# Patient Record
Sex: Male | Born: 1990 | Race: White | Hispanic: No | Marital: Single | Smoking: Never smoker
Health system: Southern US, Community
[De-identification: ages and names within clinical notes are randomized; demographics above are authoritative.]

---

## 2011-06-30 ENCOUNTER — Emergency Department (HOSPITAL_BASED_OUTPATIENT_CLINIC_OR_DEPARTMENT_OTHER)
Admission: EM | Admit: 2011-06-30 | Discharge: 2011-06-30 | Disposition: A | Discharge status: Home / Self Care | Payer: Managed Care, Other (non HMO) | Attending: Emergency Medicine | Admitting: Emergency Medicine

## 2011-06-30 ENCOUNTER — Encounter: Payer: Self-pay | Admitting: *Deleted

## 2011-06-30 DIAGNOSIS — J029 Acute pharyngitis, unspecified: Secondary | ICD-10-CM | POA: Insufficient documentation

## 2011-06-30 DIAGNOSIS — B9789 Other viral agents as the cause of diseases classified elsewhere: Secondary | ICD-10-CM | POA: Insufficient documentation

## 2011-06-30 DIAGNOSIS — B349 Viral infection, unspecified: Secondary | ICD-10-CM

## 2011-06-30 LAB — RAPID STREP SCREEN (MED CTR MEBANE ONLY): Streptococcus, Group A Screen (Direct): NEGATIVE

## 2011-06-30 NOTE — ED Notes (Signed)
Dr ray at bedside to evalutate pt  Strept screen collected by dr ray ...taken to lab

## 2011-06-30 NOTE — ED Notes (Signed)
Patient states pain has gotten worse x 3 days.

## 2011-06-30 NOTE — ED Provider Notes (Signed)
History     CSN: [id number] Arrival date & time: 06/30/2011 12:37 PM  Chief Complaint  Patient presents with  . Sore Throat  . Fever   The history is provided by the patient.   patient here complains of sore throat for 3 days. He has had a fever since Saturday. He was seen at [facility] clinic on Saturday and had a strep screen that was negative. He states that he has used ibuprofen with control of his fever but that when he is not taking it has gone up to as high as 103. He has had some headache and muscle aches. He is not had any cough or shortness of breath nausea or vomiting.  History reviewed. No pertinent past medical history.  History reviewed. No pertinent past surgical history.  History reviewed. No pertinent family history.  History  Substance Use Topics  . Smoking status: Not on file  . Smokeless tobacco: Never Used  . Alcohol Use: Yes     socially      Review of Systems  All other systems reviewed and are negative.    Physical Exam  BP 131/88  Pulse 73  Temp(Src) 99.4 F (37.4 C) (Oral)  Resp 16  Ht 6\' 1"  (1.854 m)  Wt 170 lb (77.111 kg)  BMI 22.43 kg/m2  SpO2 100%  Physical Exam  Constitutional: He appears well-developed.   well-developed well-nourished male sitting in the bed does not appear to be in any acute distress heart rate 73 respiratory rate is 16 blood pressure 130/88 oxygen saturation levels 100% his temperature is 99.4 degrees HEENT normocephalic atraumatic eyes pupils are equal round reactive to light Ears extra sternal ears are normal tympanic membranes are clear nares or pain no discharges noted Oropharynx mucous membranes are moist there is some inflammation and pustules in the oropharynx. Neck some submandibular adenopathy. Neck is supple. Chest wall is normal. Lungs clear to auscultation Heart regular rate and rhythm no murmurs or gallops are appreciated. Abdomen soft and nontender no hepatomegaly or splenomegaly noted Lower extremities  are normal Skin no evidence of rashes noted.  ED Course  Procedures  MDM The patient was negative strep screen here. I have discussed with he and his parents that this is likely viral in etiology. They voice understanding and he will call his primary doctor on Thursday.      Hilario Quarry, MD 06/30/11 [phone]

## 2013-10-17 ENCOUNTER — Ambulatory Visit (INDEPENDENT_AMBULATORY_CARE_PROVIDER_SITE_OTHER): Payer: Managed Care, Other (non HMO) | Admitting: Sports Medicine

## 2013-10-17 ENCOUNTER — Other Ambulatory Visit: Payer: Self-pay | Admitting: Sports Medicine

## 2013-10-17 ENCOUNTER — Ambulatory Visit: Payer: Managed Care, Other (non HMO)

## 2013-10-17 ENCOUNTER — Encounter: Payer: Self-pay | Admitting: Sports Medicine

## 2013-10-17 VITALS — BP 153/67 | HR 69 | Wt 184.0 lb

## 2013-10-17 DIAGNOSIS — M542 Cervicalgia: Secondary | ICD-10-CM

## 2013-10-17 MED ORDER — MELOXICAM 15 MG PO TABS: ORAL_TABLET | ORAL | Status: AC

## 2013-10-17 MED ORDER — PREDNISONE 50 MG PO TABS: ORAL_TABLET | ORAL | Status: AC

## 2013-10-17 MED ORDER — CYCLOBENZAPRINE HCL 10 MG PO TABS: ORAL_TABLET | ORAL | Status: AC

## 2013-10-17 NOTE — Progress Notes (Signed)
  Subjective:    CC: Neck pain   HPI:  This is a pleasant 22 year old male, he is in [employer], approximately one year ago he was running while carrying a bulge on his head, unfortunately he got a laceration, and has had neck pain since. The pain is located at the craniocervical junction, worse with flexion, and less so with extension. No problems with turning. He has all axial and no radicular symptoms. Pain is moderate, persistent, no lower extremity symptoms, no bowel or bladder dysfunction.  Past medical history, Surgical history, Family history not pertinant except as noted below, Social history, Allergies, and medications have been entered into the medical record, reviewed, and no changes needed.   Review of Systems: No headache, visual changes, nausea, vomiting, diarrhea, constipation, dizziness, abdominal pain, skin rash, fevers, chills, night sweats, swollen lymph nodes, weight loss, chest pain, body aches, joint swelling, muscle aches, shortness of breath, mood changes, visual or auditory hallucinations.  Objective:    General: Well Developed, well nourished, and in no acute distress.  Neuro: Alert and oriented x3, extra-ocular muscles intact, sensation grossly intact.  HEENT: Normocephalic, atraumatic, pupils equal round reactive to light, neck supple, no masses, no lymphadenopathy, thyroid nonpalpable.  Skin: Warm and dry, no rashes noted.  Cardiac: Regular rate and rhythm, no murmurs rubs or gallops.  Respiratory: Clear to auscultation bilaterally. Not using accessory muscles, speaking in full sentences.  Abdominal: Soft, nontender, nondistended, positive bowel sounds, no masses, no organomegaly.  Neck: Inspection unremarkable. No palpable stepoffs. Negative Spurling's maneuver. Full neck range of motion Grip strength and sensation normal in bilateral hands Strength good C4 to T1 distribution No sensory change to C4 to T1 Negative Hoffman sign bilaterally Reflexes  normal  Impression and Recommendations:    The patient was counselled, risk factors were discussed, anticipatory guidance given.

## 2013-10-17 NOTE — Assessment & Plan Note (Signed)
This occurred during [employer]. He was running with a boat on his head, now has persistent pain near the cervical cranial junction. We will start conservatively with home rehabilitation, prednisone, Mobic, Flexeril at bedtime. X-rays with flexion/extension views. He will be in [place] finishing his training, I have asked him to followup with [place] on my chart, and I will help him find other sports doctor in the area. Do think the next step would need to be an MRI if he feels the above conservative measures.

## 2013-10-18 ENCOUNTER — Ambulatory Visit (INDEPENDENT_AMBULATORY_CARE_PROVIDER_SITE_OTHER): Payer: Managed Care, Other (non HMO)

## 2013-10-18 DIAGNOSIS — M542 Cervicalgia: Secondary | ICD-10-CM

## 2015-04-26 ENCOUNTER — Telehealth: Payer: Self-pay | Admitting: Sports Medicine

## 2015-04-26 NOTE — Telephone Encounter (Signed)
[place] [employer] [place] [address] did a MRI and Patient is going to mail you images and Image report on a disc for your 2nd opinion. Thanks, [employer]

## 2015-05-10 NOTE — Telephone Encounter (Signed)
Ok, awaiting MRI.

## 2015-08-18 IMAGING — CR DG CERVICAL SPINE WITH FLEX & EXTEND
8 of 9 series · 8 of 9 positions shown · non-contrast
Comparison: None.

CLINICAL DATA: Pain without radiculopathy

EXAM:
CERVICAL SPINE COMPLETE WITH FLEXION AND EXTENSION VIEWS

[view not recorded (1 of 8)]
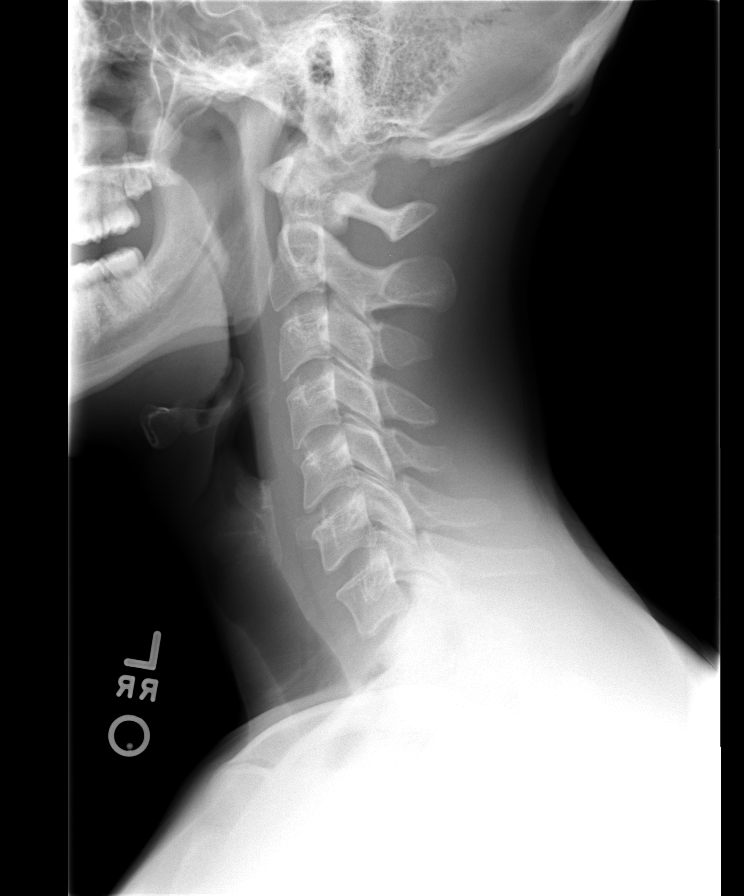

[view not recorded (2 of 8)]
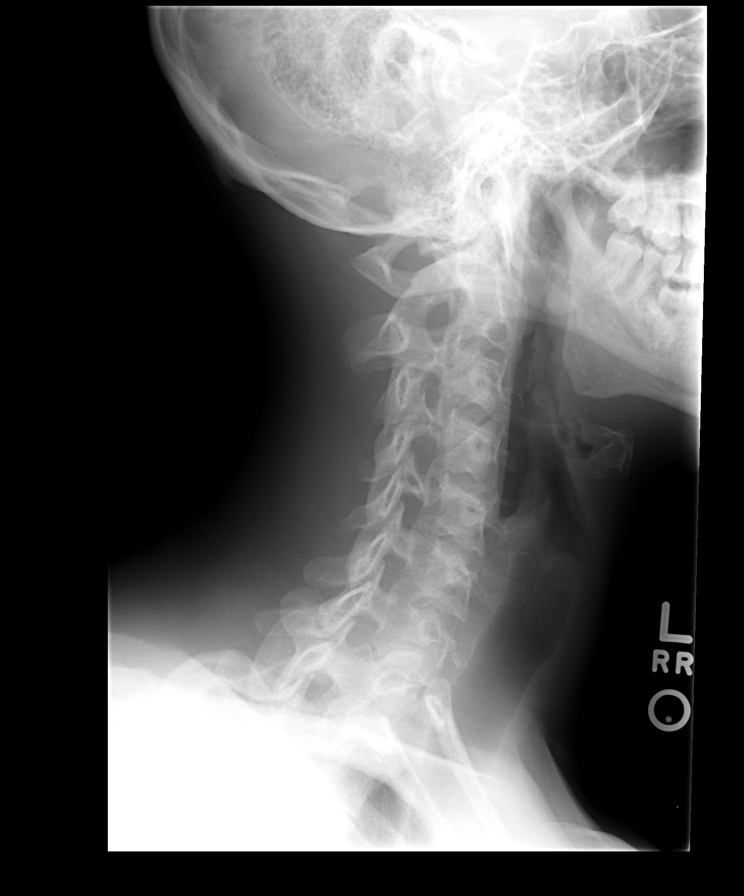

[view not recorded (3 of 8)]
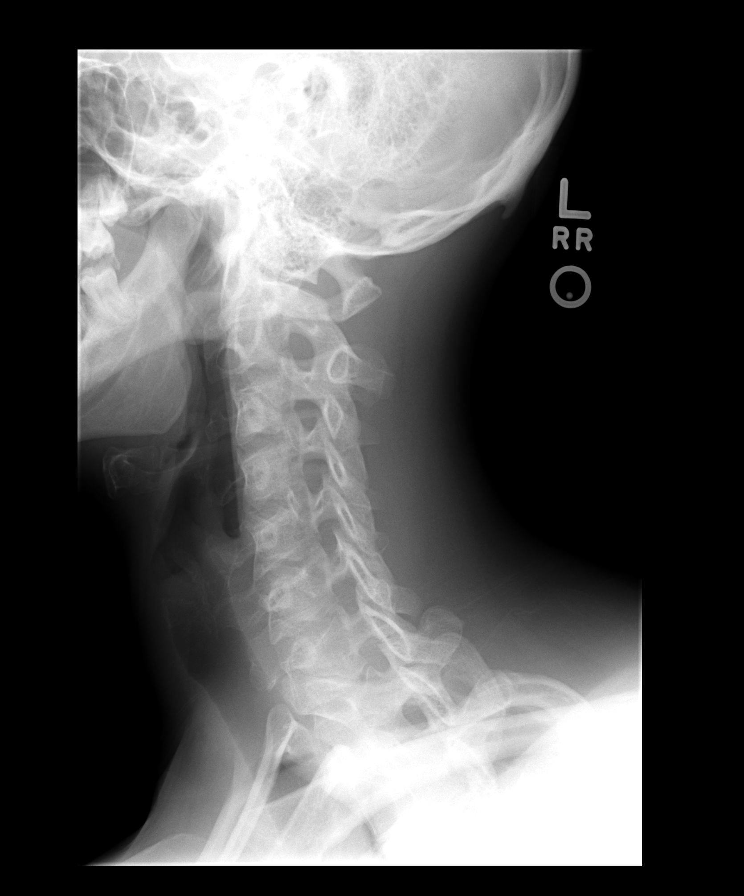

[view not recorded (4 of 8)]
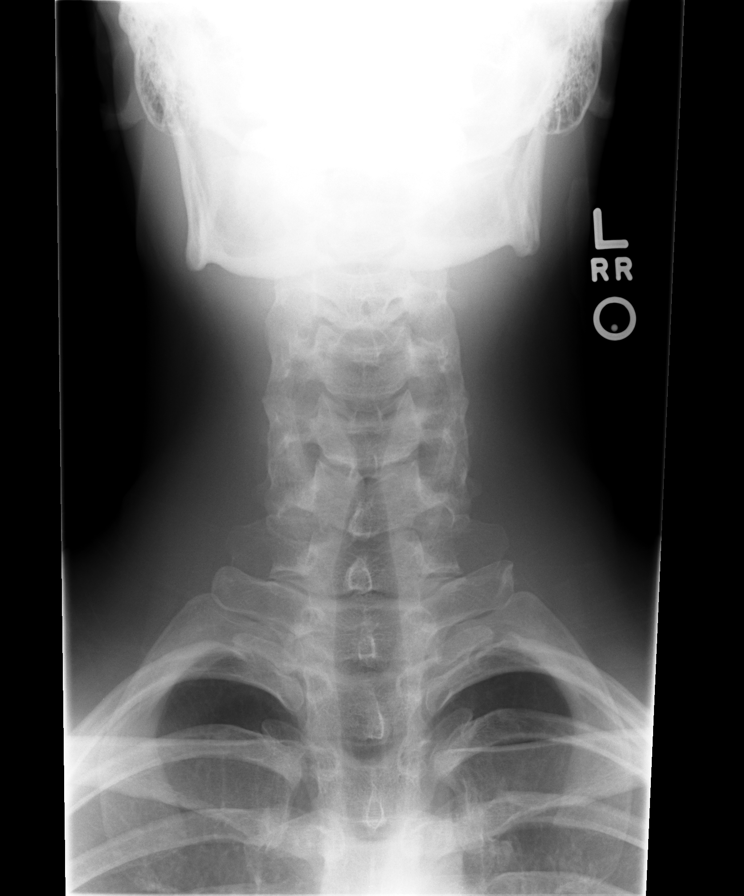

[view not recorded (5 of 8)]
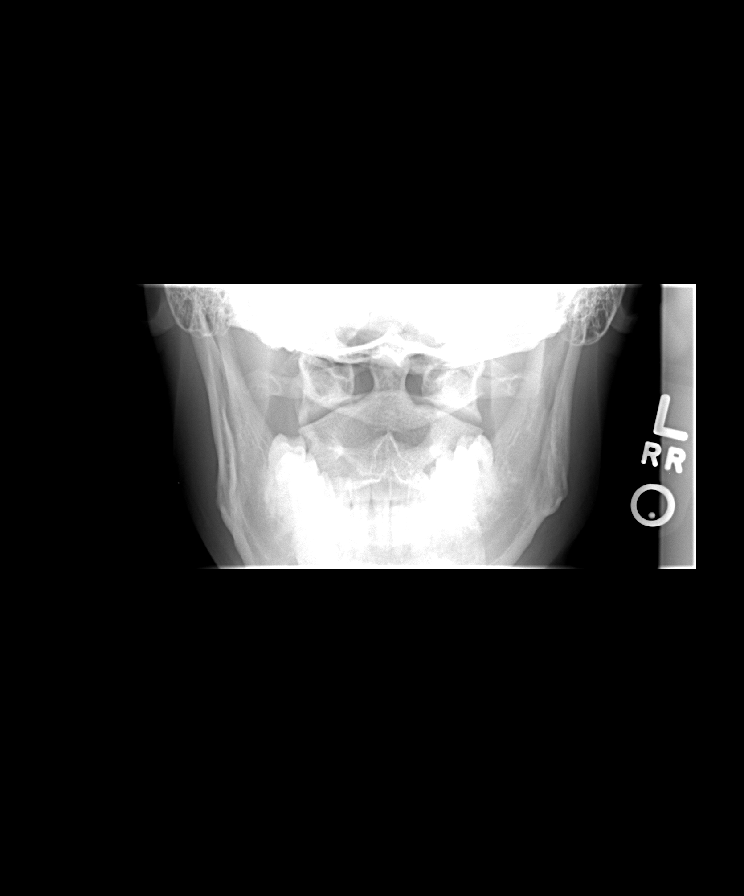

[view not recorded (6 of 8)]
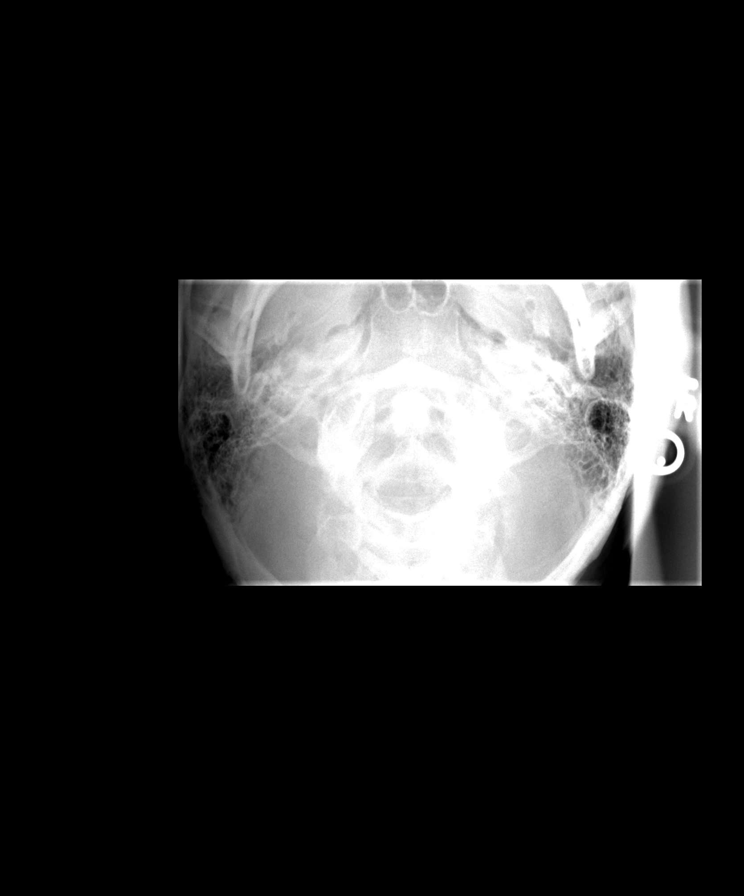

[view not recorded (7 of 8)]
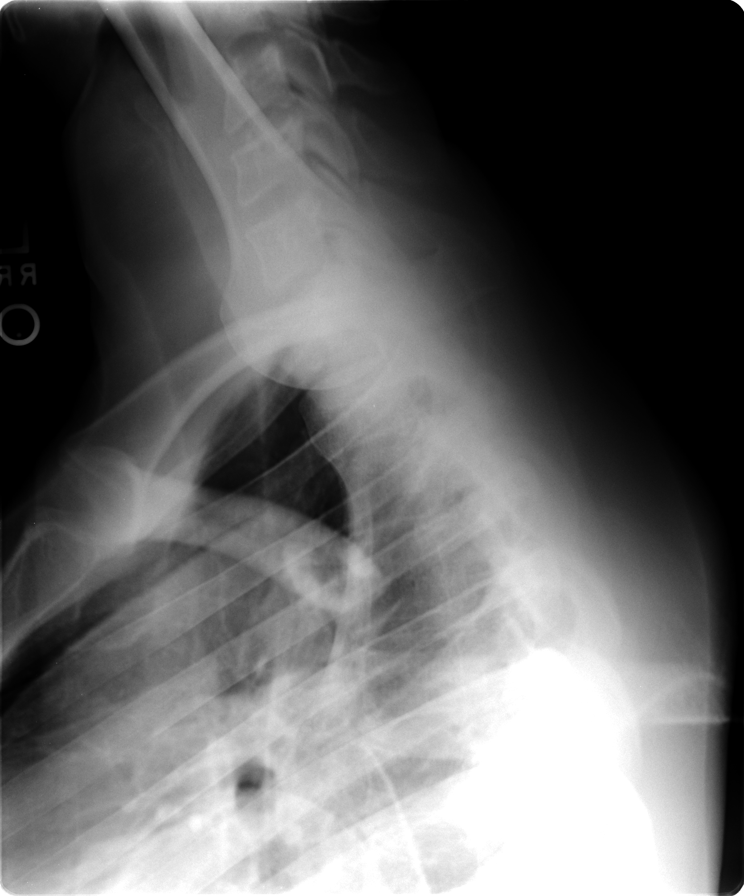

[view not recorded (8 of 8)]
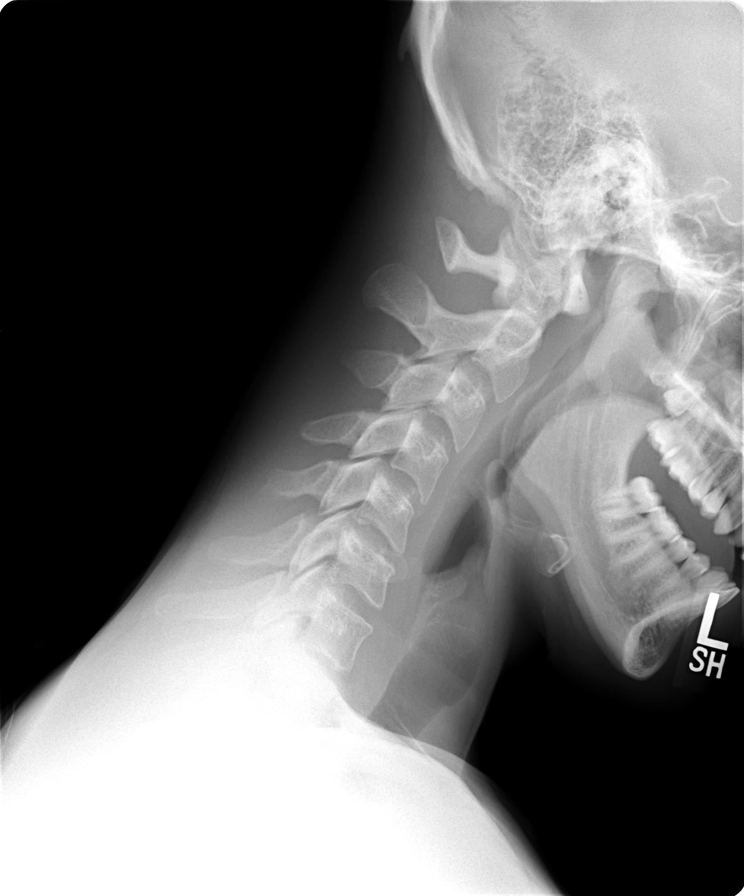

[8 of 9 positions shown; findings below may reference images not displayed]

FINDINGS: No dynamic instability on lateral flexion-extension radiographs.
Normal alignment. No prevertebral soft tissue swelling. Vertebral
body and intervertebral disc heights well-maintained throughout. No
osseous foraminal stenosis. No significant degenerative change.
IMPRESSION: Negative.  No dynamic instability on flexion/extension.
